# Patient Record
Sex: Female | Born: 1995
Health system: Southern US, Community
[De-identification: ages and names within clinical notes are randomized; demographics above are authoritative.]

## PROBLEM LIST (undated history)

## (undated) DIAGNOSIS — N200 Calculus of kidney: Secondary | ICD-10-CM

## (undated) DIAGNOSIS — L600 Ingrowing nail: Secondary | ICD-10-CM

## (undated) DIAGNOSIS — L309 Dermatitis, unspecified: Secondary | ICD-10-CM

## (undated) HISTORY — PX: ADENOIDECTOMY: SHX5191

## (undated) HISTORY — DX: Calculus of kidney: N20.0

## (undated) HISTORY — DX: Ingrowing nail: L60.0

## (undated) HISTORY — DX: Dermatitis, unspecified: L30.9

---
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
## 2017-04-28 DIAGNOSIS — L700 Acne vulgaris: Secondary | ICD-10-CM | POA: Diagnosis not present

## 2017-06-22 ENCOUNTER — Other Ambulatory Visit: Payer: Self-pay | Admitting: *Deleted

## 2017-06-22 MED ORDER — NORGESTIMATE-ETH ESTRADIOL 0.25-35 MG-MCG PO TABS: 1.0000 | ORAL_TABLET | Freq: Every day | ORAL | 0 refills | Status: DC

## 2017-06-23 ENCOUNTER — Other Ambulatory Visit: Payer: Self-pay | Admitting: *Deleted

## 2017-06-23 MED ORDER — NORGESTIMATE-ETH ESTRADIOL 0.25-35 MG-MCG PO TABS: 1.0000 | ORAL_TABLET | Freq: Every day | ORAL | 0 refills | Status: DC

## 2017-08-11 ENCOUNTER — Other Ambulatory Visit: Payer: Self-pay | Admitting: *Deleted

## 2017-08-11 MED ORDER — NORGESTIMATE-ETH ESTRADIOL 0.25-35 MG-MCG PO TABS: 1.0000 | ORAL_TABLET | Freq: Every day | ORAL | 3 refills | Status: DC

## 2017-08-27 DIAGNOSIS — M545 Low back pain: Secondary | ICD-10-CM | POA: Diagnosis not present

## 2017-08-27 DIAGNOSIS — M4325 Fusion of spine, thoracolumbar region: Secondary | ICD-10-CM | POA: Diagnosis not present

## 2017-08-27 DIAGNOSIS — M4324 Fusion of spine, thoracic region: Secondary | ICD-10-CM | POA: Diagnosis not present

## 2017-10-02 ENCOUNTER — Ambulatory Visit (INDEPENDENT_AMBULATORY_CARE_PROVIDER_SITE_OTHER): Payer: 59 | Admitting: Family Medicine

## 2017-10-02 ENCOUNTER — Encounter: Payer: Self-pay | Admitting: Family Medicine

## 2017-10-02 VITALS — BP 122/85 | HR 88 | Temp 98.2°F | Resp 20 | Ht 65.0 in | Wt 122.0 lb

## 2017-10-02 DIAGNOSIS — Z3041 Encounter for surveillance of contraceptive pills: Secondary | ICD-10-CM | POA: Diagnosis not present

## 2017-10-02 DIAGNOSIS — Z0000 Encounter for general adult medical examination without abnormal findings: Secondary | ICD-10-CM

## 2017-10-02 DIAGNOSIS — Z789 Other specified health status: Secondary | ICD-10-CM | POA: Insufficient documentation

## 2017-10-02 DIAGNOSIS — Z00129 Encounter for routine child health examination without abnormal findings: Secondary | ICD-10-CM

## 2017-10-02 DIAGNOSIS — L309 Dermatitis, unspecified: Secondary | ICD-10-CM

## 2017-10-02 MED ORDER — TRIAMCINOLONE ACETONIDE 0.1 % EX CREA: 1.0000 "application " | TOPICAL_CREAM | Freq: Two times a day (BID) | CUTANEOUS | 0 refills | Status: DC

## 2017-10-02 MED ORDER — NORGESTIMATE-ETH ESTRADIOL 0.25-35 MG-MCG PO TABS: 1.0000 | ORAL_TABLET | Freq: Every day | ORAL | 4 refills | Status: DC

## 2017-10-02 NOTE — Progress Notes (Signed)
Patient ID: Mehek Grega, female   DOB: March 01, 1996, 21 y.o.   MRN: [id number] Subjective:     History was provided by the patient.   Manasvi Dickard is a 21 y.o. female who is here for CPE.  She is starting her 2nd year of college, she is living with 5 other females in a suite. She has decided on majoring in psychology. She reports she did rather well last year for her first-year college. She does exercise and tries to eat healthy. She denies tobacco or drug use. She admits to some alcohol use but always uses a DD and has friends around.  Current Issues: Current concerns include:  BCP refills needed. She reports she is not sexually active, she is abstinent. Her menstrual cycle is regular now that on the birth control pill, last approximately 3-4 days and is light. She is very pleased with this. Patient's last menstrual period was 09/08/2017.   Immunization History  Administered Date(s) Administered  . DTaP 06/21/1998, 08/22/1998, 10/17/1998, 10/16/1999, 03/24/2003  . HPV Quadrivalent 06/30/2013, 08/31/2013, 01/02/2014  . Hepatitis B October 07, 1996, 05/16/1998, 10/17/1998  . HiB (PRP-OMP) 06/21/1998, 08/22/1998, 10/17/1998, 07/01/1999  . IPV 06/21/1998, 08/22/1998, 04/05/1999, 03/24/2003  . MMR 07/01/1999, 04/03/2003  . Meningococcal Conjugate 07/20/2014  . PPD Test 05/26/2016  . Tdap 05/10/2009  . Varicella 07/01/1999, 05/10/2009     Objective:     Vitals:   10/02/17 1355  BP: 122/85  Pulse: 88  Resp: 20  Temp: 98.2 F (36.8 C)  SpO2: 100%  Weight: 122 lb (55.3 kg)  Height: 5' 5"  (1.651 m)   Growth parameters are noted and are appropriate for age. Gen: Afebrile. No acute distress.  HENT: AT. [place]. Bilateral TM visualized and normal in appearance. MMM. Bilateral nares Without erythema or swelling. Throat without erythema or exudates. No cough, no hoarseness. Eyes:Pupils Equal Round Reactive to light, Extraocular movements intact,  Conjunctiva without redness, discharge or  icterus. Neck/lymp/endocrine: Supple, no lymphadenopathy, no thyromegaly CV: RRR no murmur, no edema, +2/4 P posterior tibialis pulses Chest: CTAB, no wheeze or crackles Abd: Soft. Flat. NTND. BS present. No Masses palpated.  Skin: Mild eczema left antecubital fossa, no purpura or petechiae.  Neuro:  Normal gait. PERLA. EOMi. Alert. Oriented. Cranial nerves II through XII intact. Muscle strength 5/5 upper and lower extremity extremity. Scoliosis present. DTRs equal bilaterally. Psych: Normal affect, dress and demeanor. Normal speech. Normal thought content and judgment..     No exam data present  Assessment:  Netasha Wehrli is a 21 y.o. healthy female present for CPE UTD immunizations - Flu shot given at school.  BCP Continual management- refills provided today for 1 year. Eczema: Triamcinolone cream prescribed, cerve after showers. Nutrition, Physical activity, Behavior, Emergency Care, [place], Safety and Handout given Follow-up one year for CPE  Electronically Signed by: Howard Pouch, DO [place] [facility] [place]

## 2017-10-02 NOTE — Patient Instructions (Signed)
Good luck to you this year in school.   Health Maintenance, Female Adopting a healthy lifestyle and getting preventive care can go a long way to promote health and wellness. Talk with your health care provider about what schedule of regular examinations is right for you. This is a good chance for you to check in with your provider about disease prevention and staying healthy. In between checkups, there are plenty of things you can do on your own. Experts have done a lot of research about which lifestyle changes and preventive measures are most likely to keep you healthy. Ask your health care provider for more information. Weight and diet Eat a healthy diet  Be sure to include plenty of vegetables, fruits, low-fat dairy products, and lean protein.  Do not eat a lot of foods high in solid fats, added sugars, or salt.  Get regular exercise. This is one of the most important things you can do for your health. ? Most adults should exercise for at least 150 minutes each week. The exercise should increase your heart rate and make you sweat (moderate-intensity exercise). ? Most adults should also do strengthening exercises at least twice a week. This is in addition to the moderate-intensity exercise.  Maintain a healthy weight  Body mass index (BMI) is a measurement that can be used to identify possible weight problems. It estimates body fat based on height and weight. Your health care provider can help determine your BMI and help you achieve or maintain a healthy weight.  For females 31 years of age and older: ? A BMI below 18.5 is considered underweight. ? A BMI of 18.5 to 24.9 is normal. ? A BMI of 25 to 29.9 is considered overweight. ? A BMI of 30 and above is considered obese.  Watch levels of cholesterol and blood lipids  You should start having your blood tested for lipids and cholesterol at 19 years of age, then have this test every 5 years.  You may need to have your cholesterol levels  checked more often if: ? Your lipid or cholesterol levels are high. ? You are older than 19 years of age. ? You are at high risk for heart disease.  Cancer screening Lung Cancer  Lung cancer screening is recommended for adults 23-83 years old who are at high risk for lung cancer because of a history of smoking.  A yearly low-dose CT scan of the lungs is recommended for people who: ? Currently smoke. ? Have quit within the past 15 years. ? Have at least a 30-pack-year history of smoking. A pack year is smoking an average of one pack of cigarettes a day for 1 year.  Yearly screening should continue until it has been 15 years since you quit.  Yearly screening should stop if you develop a health problem that would prevent you from having lung cancer treatment.  Breast Cancer  Practice breast self-awareness. This means understanding how your breasts normally appear and feel.  It also means doing regular breast self-exams. Let your health care provider know about any changes, no matter how small.  If you are in your 20s or 30s, you should have a clinical breast exam (CBE) by a health care provider every 1-3 years as part of a regular health exam.  If you are 56 or older, have a CBE every year. Also consider having a breast X-ray (mammogram) every year.  If you have a family history of breast cancer, talk to your health care provider  about genetic screening.  If you are at high risk for breast cancer, talk to your health care provider about having an MRI and a mammogram every year.  Breast cancer gene (BRCA) assessment is recommended for women who have family members with BRCA-related cancers. BRCA-related cancers include: ? Breast. ? Ovarian. ? Tubal. ? Peritoneal cancers.  Results of the assessment will determine the need for genetic counseling and BRCA1 and BRCA2 testing.  Cervical Cancer Your health care provider may recommend that you be screened regularly for cancer of the  pelvic organs (ovaries, uterus, and vagina). This screening involves a pelvic examination, including checking for microscopic changes to the surface of your cervix (Pap test). You may be encouraged to have this screening done every 3 years, beginning at age 3.  For women ages 67-65, health care providers may recommend pelvic exams and Pap testing every 3 years, or they may recommend the Pap and pelvic exam, combined with testing for human papilloma virus (HPV), every 5 years. Some types of HPV increase your risk of cervical cancer. Testing for HPV may also be done on women of any age with unclear Pap test results.  Other health care providers may not recommend any screening for nonpregnant women who are considered low risk for pelvic cancer and who do not have symptoms. Ask your health care provider if a screening pelvic exam is right for you.  If you have had past treatment for cervical cancer or a condition that could lead to cancer, you need Pap tests and screening for cancer for at least 20 years after your treatment. If Pap tests have been discontinued, your risk factors (such as having a new sexual partner) need to be reassessed to determine if screening should resume. Some women have medical problems that increase the chance of getting cervical cancer. In these cases, your health care provider may recommend more frequent screening and Pap tests.  Colorectal Cancer  This type of cancer can be detected and often prevented.  Routine colorectal cancer screening usually begins at 19 years of age and continues through 20 years of age.  Your health care provider may recommend screening at an earlier age if you have risk factors for colon cancer.  Your health care provider may also recommend using home test kits to check for hidden blood in the stool.  A small camera at the end of a tube can be used to examine your colon directly (sigmoidoscopy or colonoscopy). This is done to check for the  earliest forms of colorectal cancer.  Routine screening usually begins at age 46.  Direct examination of the colon should be repeated every 5-10 years through 19 years of age. However, you may need to be screened more often if early forms of precancerous polyps or small growths are found.  Skin Cancer  Check your skin from head to toe regularly.  Tell your health care provider about any new moles or changes in moles, especially if there is a change in a mole's shape or color.  Also tell your health care provider if you have a mole that is larger than the size of a pencil eraser.  Always use sunscreen. Apply sunscreen liberally and repeatedly throughout the day.  Protect yourself by wearing long sleeves, pants, a wide-brimmed hat, and sunglasses whenever you are outside.  Heart disease, diabetes, and high blood pressure  High blood pressure causes heart disease and increases the risk of stroke. High blood pressure is more likely to develop in: ?  People who have blood pressure in the high end of the normal range (130-139/85-89 mm Hg). ? People who are overweight or obese. ? People who are African American.  If you are 18-39 years of age, have your blood pressure checked every 3-5 years. If you are 40 years of age or older, have your blood pressure checked every year. You should have your blood pressure measured twice-once when you are at a hospital or clinic, and once when you are not at a hospital or clinic. Record the average of the two measurements. To check your blood pressure when you are not at a hospital or clinic, you can use: ? An automated blood pressure machine at a pharmacy. ? A home blood pressure monitor.  If you are between 55 years and 79 years old, ask your health care provider if you should take aspirin to prevent strokes.  Have regular diabetes screenings. This involves taking a blood sample to check your fasting blood sugar level. ? If you are at a normal weight and  have a low risk for diabetes, have this test once every three years after 19 years of age. ? If you are overweight and have a high risk for diabetes, consider being tested at a younger age or more often. Preventing infection Hepatitis B  If you have a higher risk for hepatitis B, you should be screened for this virus. You are considered at high risk for hepatitis B if: ? You were born in a country where hepatitis B is common. Ask your health care provider which countries are considered high risk. ? Your parents were born in a high-risk country, and you have not been immunized against hepatitis B (hepatitis B vaccine). ? You have HIV or AIDS. ? You use needles to inject street drugs. ? You live with someone who has hepatitis B. ? You have had sex with someone who has hepatitis B. ? You get hemodialysis treatment. ? You take certain medicines for conditions, including cancer, organ transplantation, and autoimmune conditions.  Hepatitis C  Blood testing is recommended for: ? Everyone born from 1945 through 1965. ? Anyone with known risk factors for hepatitis C.  Sexually transmitted infections (STIs)  You should be screened for sexually transmitted infections (STIs) including gonorrhea and chlamydia if: ? You are sexually active and are younger than 19 years of age. ? You are older than 19 years of age and your health care provider tells you that you are at risk for this type of infection. ? Your sexual activity has changed since you were last screened and you are at an increased risk for chlamydia or gonorrhea. Ask your health care provider if you are at risk.  If you do not have HIV, but are at risk, it may be recommended that you take a prescription medicine daily to prevent HIV infection. This is called pre-exposure prophylaxis (PrEP). You are considered at risk if: ? You are sexually active and do not regularly use condoms or know the HIV status of your partner(s). ? You take drugs by  injection. ? You are sexually active with a partner who has HIV.  Talk with your health care provider about whether you are at high risk of being infected with HIV. If you choose to begin PrEP, you should first be tested for HIV. You should then be tested every 3 months for as long as you are taking PrEP. Pregnancy  If you are premenopausal and you may become pregnant, ask your health   care provider about preconception counseling.  If you may become pregnant, take 400 to 800 micrograms (mcg) of folic acid every day.  If you want to prevent pregnancy, talk to your health care provider about birth control (contraception). Osteoporosis and menopause  Osteoporosis is a disease in which the bones lose minerals and strength with aging. This can result in serious bone fractures. Your risk for osteoporosis can be identified using a bone density scan.  If you are 38 years of age or older, or if you are at risk for osteoporosis and fractures, ask your health care provider if you should be screened.  Ask your health care provider whether you should take a calcium or vitamin D supplement to lower your risk for osteoporosis.  Menopause may have certain physical symptoms and risks.  Hormone replacement therapy may reduce some of these symptoms and risks. Talk to your health care provider about whether hormone replacement therapy is right for you. Follow these instructions at home:  Schedule regular health, dental, and eye exams.  Stay current with your immunizations.  Do not use any tobacco products including cigarettes, chewing tobacco, or electronic cigarettes.  If you are pregnant, do not drink alcohol.  If you are breastfeeding, limit how much and how often you drink alcohol.  Limit alcohol intake to no more than 1 drink per day for nonpregnant women. One drink equals 12 ounces of beer, 5 ounces of wine, or 1 ounces of hard liquor.  Do not use street drugs.  Do not share needles.  Ask  your health care provider for help if you need support or information about quitting drugs.  Tell your health care provider if you often feel depressed.  Tell your health care provider if you have ever been abused or do not feel safe at home. This information is not intended to replace advice given to you by your health care provider. Make sure you discuss any questions you have with your health care provider. Document Released: 06/16/2011 Document Revised: 05/08/2016 Document Reviewed: 09/04/2015 Elsevier Interactive Patient Education  [employer].

## 2018-04-29 DIAGNOSIS — M4325 Fusion of spine, thoracolumbar region: Secondary | ICD-10-CM | POA: Diagnosis not present

## 2018-04-29 DIAGNOSIS — M545 Low back pain: Secondary | ICD-10-CM | POA: Diagnosis not present

## 2018-05-17 DIAGNOSIS — M545 Low back pain: Secondary | ICD-10-CM | POA: Diagnosis not present

## 2018-05-17 DIAGNOSIS — M6281 Muscle weakness (generalized): Secondary | ICD-10-CM | POA: Diagnosis not present

## 2018-05-20 DIAGNOSIS — M545 Low back pain: Secondary | ICD-10-CM | POA: Diagnosis not present

## 2018-05-20 DIAGNOSIS — M6281 Muscle weakness (generalized): Secondary | ICD-10-CM | POA: Diagnosis not present

## 2018-05-24 DIAGNOSIS — L700 Acne vulgaris: Secondary | ICD-10-CM | POA: Diagnosis not present

## 2018-05-24 DIAGNOSIS — M545 Low back pain: Secondary | ICD-10-CM | POA: Diagnosis not present

## 2018-05-24 DIAGNOSIS — M6281 Muscle weakness (generalized): Secondary | ICD-10-CM | POA: Diagnosis not present

## 2018-05-27 DIAGNOSIS — M6281 Muscle weakness (generalized): Secondary | ICD-10-CM | POA: Diagnosis not present

## 2018-05-27 DIAGNOSIS — M545 Low back pain: Secondary | ICD-10-CM | POA: Diagnosis not present

## 2018-05-31 DIAGNOSIS — M6281 Muscle weakness (generalized): Secondary | ICD-10-CM | POA: Diagnosis not present

## 2018-05-31 DIAGNOSIS — M545 Low back pain: Secondary | ICD-10-CM | POA: Diagnosis not present

## 2018-06-03 DIAGNOSIS — M6281 Muscle weakness (generalized): Secondary | ICD-10-CM | POA: Diagnosis not present

## 2018-06-03 DIAGNOSIS — M545 Low back pain: Secondary | ICD-10-CM | POA: Diagnosis not present

## 2018-06-07 DIAGNOSIS — M6281 Muscle weakness (generalized): Secondary | ICD-10-CM | POA: Diagnosis not present

## 2018-06-07 DIAGNOSIS — M545 Low back pain: Secondary | ICD-10-CM | POA: Diagnosis not present

## 2018-06-14 DIAGNOSIS — M545 Low back pain: Secondary | ICD-10-CM | POA: Diagnosis not present

## 2018-06-14 DIAGNOSIS — M6281 Muscle weakness (generalized): Secondary | ICD-10-CM | POA: Diagnosis not present

## 2018-06-28 DIAGNOSIS — M545 Low back pain: Secondary | ICD-10-CM | POA: Diagnosis not present

## 2018-06-28 DIAGNOSIS — M6281 Muscle weakness (generalized): Secondary | ICD-10-CM | POA: Diagnosis not present

## 2018-10-15 ENCOUNTER — Encounter: Payer: Self-pay | Admitting: Family Medicine

## 2018-11-10 ENCOUNTER — Encounter: Payer: 59 | Admitting: Family Medicine

## 2018-11-29 ENCOUNTER — Encounter: Payer: Self-pay | Admitting: Family Medicine

## 2018-11-29 ENCOUNTER — Ambulatory Visit (INDEPENDENT_AMBULATORY_CARE_PROVIDER_SITE_OTHER): Payer: 59 | Admitting: Family Medicine

## 2018-11-29 VITALS — BP 117/78 | HR 94 | Temp 98.5°F | Resp 16 | Ht 65.75 in | Wt 123.0 lb

## 2018-11-29 DIAGNOSIS — Z0000 Encounter for general adult medical examination without abnormal findings: Secondary | ICD-10-CM

## 2018-11-29 DIAGNOSIS — Z130 Encounter for screening for diseases of the blood and blood-forming organs and certain disorders involving the immune mechanism: Secondary | ICD-10-CM | POA: Diagnosis not present

## 2018-11-29 DIAGNOSIS — Z789 Other specified health status: Secondary | ICD-10-CM

## 2018-11-29 DIAGNOSIS — L309 Dermatitis, unspecified: Secondary | ICD-10-CM | POA: Diagnosis not present

## 2018-11-29 DIAGNOSIS — Z3041 Encounter for surveillance of contraceptive pills: Secondary | ICD-10-CM

## 2018-11-29 DIAGNOSIS — M549 Dorsalgia, unspecified: Secondary | ICD-10-CM | POA: Insufficient documentation

## 2018-11-29 DIAGNOSIS — M419 Scoliosis, unspecified: Secondary | ICD-10-CM | POA: Insufficient documentation

## 2018-11-29 LAB — CBC WITH DIFFERENTIAL/PLATELET
BASOS ABS: 0 10*3/uL (ref 0.0–0.1)
Basophils Relative: 0.6 % (ref 0.0–3.0)
EOS ABS: 0.2 10*3/uL (ref 0.0–0.7)
Eosinophils Relative: 2.2 % (ref 0.0–5.0)
HEMATOCRIT: 42.5 % (ref 36.0–46.0)
Hemoglobin: 14.3 g/dL (ref 12.0–15.0)
Lymphocytes Relative: 32.3 % (ref 12.0–46.0)
Lymphs Abs: 2.3 10*3/uL (ref 0.7–4.0)
MCHC: 33.6 g/dL (ref 30.0–36.0)
MCV: 92.8 fl (ref 78.0–100.0)
MONOS PCT: 4.7 % (ref 3.0–12.0)
Monocytes Absolute: 0.3 10*3/uL (ref 0.1–1.0)
NEUTROS PCT: 60.2 % (ref 43.0–77.0)
Neutro Abs: 4.3 10*3/uL (ref 1.4–7.7)
Platelets: 245 10*3/uL (ref 150.0–400.0)
RBC: 4.58 Mil/uL (ref 3.87–5.11)
RDW: 12.6 % (ref 11.5–14.6)
WBC: 7.2 10*3/uL (ref 4.5–10.5)

## 2018-11-29 LAB — COMPREHENSIVE METABOLIC PANEL
ALK PHOS: 44 U/L (ref 39–117)
ALT: 8 U/L (ref 0–35)
AST: 17 U/L (ref 0–37)
Albumin: 4.4 g/dL (ref 3.5–5.2)
BILIRUBIN TOTAL: 0.7 mg/dL (ref 0.2–1.2)
BUN: 11 mg/dL (ref 6–23)
CALCIUM: 9.8 mg/dL (ref 8.4–10.5)
CO2: 28 mEq/L (ref 19–32)
CREATININE: 0.75 mg/dL (ref 0.40–1.20)
Chloride: 103 mEq/L (ref 96–112)
GFR: 104.05 mL/min (ref >60.00)
GLUCOSE: 96 mg/dL (ref 70–99)
Potassium: 4.3 mEq/L (ref 3.5–5.1)
Sodium: 137 mEq/L (ref 135–145)
TOTAL PROTEIN: 6.6 g/dL (ref 6.0–8.3)

## 2018-11-29 LAB — LIPID PANEL
CHOL/HDL RATIO: 2
Cholesterol: 121 mg/dL (ref 0–200)
HDL: 57.1 mg/dL (ref >39.00)
LDL Cholesterol: 34 mg/dL (ref 0–99)
NONHDL: 63.4
Triglycerides: 148 mg/dL (ref 0.0–149.0)
VLDL: 29.6 mg/dL (ref 0.0–40.0)

## 2018-11-29 MED ORDER — NORGESTIMATE-ETH ESTRADIOL 0.25-35 MG-MCG PO TABS: 1.0000 | ORAL_TABLET | Freq: Every day | ORAL | 4 refills | Status: DC

## 2018-11-29 MED ORDER — TRIAMCINOLONE ACETONIDE 0.1 % EX CREA: 1.0000 "application " | TOPICAL_CREAM | Freq: Two times a day (BID) | CUTANEOUS | 0 refills | Status: DC

## 2018-11-29 MED ORDER — NORGESTIMATE-ETH ESTRADIOL 0.25-35 MG-MCG PO TABS: 1.0000 | ORAL_TABLET | Freq: Every day | ORAL | 4 refills | Status: AC

## 2018-11-29 NOTE — Patient Instructions (Signed)

## 2018-11-29 NOTE — Progress Notes (Signed)
Patient ID: Sonya Love, female  DOB: July 02, 1996, 22 y.o.   MRN: [id number] Patient Care Team    Relationship Specialty Notifications Start End  Ma Hillock, DO PCP - General Family Medicine  07/25/15   Leandrew Koyanagi., MD Consulting Physician Urology  02/03/12     Chief Complaint  Patient presents with  . Annual Exam    Pt is not fasting. Pt declines the flu vaccine.    Subjective:  Sonya Love is a 22 y.o.  Female  present for CPE. All past medical history, surgical history, allergies, family history, immunizations, medications and social history were updated in the electronic medical record today. All recent labs, ED visits and hospitalizations within the last year were reviewed.  Well women: cycles are routine on BCP, menses 4 days and light. Patient's last menstrual period was 11/02/2018 (approximate). She has never been  SA. She denies vaginal irritation or discharge.   Health maintenance:  Colonoscopy: no fhx, routine screen Mammogram: no fhx, routine screen Cervical cancer screening: no prior. Patient's last menstrual period was 11/02/2018 (approximate). Immunizations: tdap 05/10/2009, Influenza declined (encouraged yearly) Infectious disease screening: HIV not indicated DEXA: N/A Assistive device: none Oxygen use: none Patient has a Dental home. Hospitalizations/ED visits: reviewed  Depression screen [facility] 2/9 11/29/2018 10/02/2017 10/02/2016  Decreased Interest 0 0 0  Down, Depressed, Hopeless 0 0 0  PHQ - 2 Score 0 0 0   No flowsheet data found.   Immunization History  Administered Date(s) Administered  . DTaP 06/21/1998, 08/22/1998, 10/17/1998, 10/16/1999, 03/24/2003  . HPV Quadrivalent 06/30/2013, 08/31/2013, 01/02/2014  . Hepatitis B 10-Aug-1998, 05/16/1998, 10/17/1998  . HiB (PRP-OMP) 06/21/1998, 08/22/1998, 10/17/1998, 07/01/1999  . IPV 06/21/1998, 08/22/1998, 04/05/1999, 03/24/2003  . MMR 07/01/1999, 04/03/2003  . Meningococcal Conjugate 07/20/2014    . PPD Test 05/26/2016  . Tdap 05/10/2009  . Varicella 07/01/1999, 05/10/2009     Past Medical History:  Diagnosis Date  . Eczema    Usually arms and neck--OTC hydrocortisone usually effective  . Ingrown left big toenail 2013  . Ingrown right big toenail 2013  . Kidney stone   . Nephrolithiasis 11/2011   First episode Christmas 2012   No Known Allergies Past Surgical History:  Procedure Laterality Date  . ADENOIDECTOMY  age 48  . spinal fushion  06/11/2015  . TOENAIL EXCISION  2013   both feet great toes   Family History  Problem Relation Age of Onset  . Diabetes Paternal Grandmother   . COPD Paternal Grandmother   . Lung cancer Maternal Grandmother        lung  . Stroke Mother    Social History   Socioeconomic History  . Marital status: Single    Spouse name: Not on file  . Number of children: Not on file  . Years of education: Not on file  . Highest education level: Not on file  Occupational History  . Occupation: high Retail buyer  Social Needs  . Financial resource strain: Not on file  . Food insecurity:    Worry: Not on file    Inability: Not on file  . Transportation needs:    Medical: Not on file    Non-medical: Not on file  Tobacco Use  . Smoking status: Never Smoker  . Smokeless tobacco: Never Used  Substance and Sexual Activity  . Alcohol use: No  . Drug use: No  . Sexual activity: Never    Comment: never been sexually active  Lifestyle  .  Physical activity:    Days per week: Not on file    Minutes per session: Not on file  . Stress: Not on file  Relationships  . Social connections:    Talks on phone: Not on file    Gets together: Not on file    Attends religious service: Not on file    Active member of club or organization: Not on file    Attends meetings of clubs or organizations: Not on file    Relationship status: Not on file  . Intimate partner violence:    Fear of current or ex partner: Not on file    Emotionally abused: Not  on file    Physically abused: Not on file    Forced sexual activity: Not on file  Other Topics Concern  . Not on file  Social History Narrative   [employer], rising [employer].   Has one older brother in the [employer]. Half sister.   Lives with mother. Plays piano. Taking Spanish.    Allergies as of 11/29/2018   No Known Allergies     Medication List       Accurate as of November 29, 2018  2:15 PM. Always use your most recent med list.        minocycline 100 MG capsule Commonly known as:  MINOCIN,DYNACIN Take 100 mg by mouth 2 (two) times daily.   multivitamin capsule Take 1 capsule by mouth daily.   norgestimate-ethinyl estradiol 0.25-35 MG-MCG tablet Commonly known as:  SPRINTEC 28 Take 1 tablet by mouth daily.   POTASSIUM CITRATE PO Take by mouth 2 (two) times daily.   triamcinolone cream 0.1 % Commonly known as:  KENALOG Apply 1 application topically 2 (two) times daily.       All past medical history, surgical history, allergies, family history, immunizations andmedications were updated in the EMR today and reviewed under the history and medication portions of their EMR.     No results found for this or any previous visit (from the past 2160 hour(s)).   ROS: 14 pt review of systems performed and negative (unless mentioned in an HPI)  Objective: BP 117/78 (BP Location: Left Arm, Patient Position: Sitting, Cuff Size: Normal)   Pulse 94   Temp 98.5 F (36.9 C) (Oral)   Resp 16   Ht 5' 5.75" (1.67 m)   Wt 123 lb (55.8 kg)   LMP 11/02/2018 (Approximate)   SpO2 98%   BMI 20.00 kg/m  Gen: Afebrile. No acute distress. Nontoxic in appearance, well-developed, well-nourished, pleasant caucasian female.  HENT: AT. [place]. Bilateral TM visualized and normal in appearance, normal external auditory canal. MMM, no oral lesions, adequate dentition. Bilateral nares within normal limits. Throat without erythema, ulcerations or exudates. no Cough on exam, no  hoarseness on exam. Eyes:Pupils Equal Round Reactive to light, Extraocular movements intact,  Conjunctiva without redness, discharge or icterus. Neck/lymp/endocrine: Supple,no lymphadenopathy, no thyromegaly CV: RRR no murmur, no edema, +2/4 P posterior tibialis pulses.  Chest: CTAB, no wheeze, rhonchi or crackles. normal Respiratory effort.  Air movement. Abd: Soft. flat. NTND. BS present. no Masses palpated. No hepatosplenomegaly. No rebound tenderness or guarding. Skin: no rashes, purpura or petechiae. Warm and well-perfused. Skin intact. Neuro/Msk:  Normal gait. PERLA. EOMi. Alert. Oriented x3.  Cranial nerves II through XII intact. Muscle strength 5/5 upper/lower extremity. DTRs equal bilaterally. Psych: Normal affect, dress and demeanor. Normal speech. Normal thought content and judgment.  No exam data present  Assessment/plan: Sonya Love is  a 22 y.o. female present for CPE Encounter for preventive health examination Patient was encouraged to exercise greater than 150 minutes a week. Patient was encouraged to choose a diet filled with fresh fruits and vegetables, and lean meats. AVS provided to patient today for education/recommendation on gender specific health and safety maintenance. Colonoscopy: no fhx, routine screen Mammogram: no fhx, routine screen Cervical cancer screening: no prior. Patient's last menstrual period was 11/02/2018 (approximate). Discussed PAP to start at 23. She has never been SA. Encouraged Gynecology establishment within the next year.  Immunizations: tdap 05/10/2009, Influenza declined (encouraged yearly) Infectious disease screening: HIV not indicated. Never SA DEXA: N/A birth control Maintenance  - stable- refills provided today. Never SA. - Comp Met (CMET) - Lipid Profile - norgestimate-ethinyl estradiol (SPRINTEC 28) 0.25-35 MG-MCG tablet; Take 1 tablet by mouth daily.  Dispense: 3 Package; Refill: 4 Eczema, unspecified type - stable.  - triamcinolone  cream (KENALOG) 0.1 %; Apply 1 application topically 2 (two) times daily.  Dispense: 30 g; Refill: 0 Screening for deficiency anemia - CBC w/Diff  Her td is due in 04/2019- if she desires she can have completed here by nurse visit at that time.   Return in about 1 year (around 11/30/2019) for CPE.  Electronically signed by: Howard Pouch, DO [place]

## 2019-04-08 ENCOUNTER — Other Ambulatory Visit: Payer: Self-pay | Admitting: Family Medicine

## 2019-04-08 DIAGNOSIS — L309 Dermatitis, unspecified: Secondary | ICD-10-CM

## 2020-02-13 LAB — OB RESULTS CONSOLE GC/CHLAMYDIA: Chlamydia: NEGATIVE

## 2020-02-13 LAB — HM PAP SMEAR: HM Pap smear: NORMAL

## 2020-05-30 ENCOUNTER — Ambulatory Visit: Payer: 59 | Admitting: Family Medicine

## 2020-06-05 ENCOUNTER — Encounter: Payer: Self-pay | Admitting: Family Medicine

## 2020-06-05 ENCOUNTER — Other Ambulatory Visit: Payer: Self-pay

## 2020-06-05 ENCOUNTER — Other Ambulatory Visit (HOSPITAL_COMMUNITY)
Admission: RE | Admit: 2020-06-05 | Discharge: 2020-06-05 | Disposition: A | Discharge status: Home / Self Care | Payer: 59 | Source: Ambulatory Visit | Attending: Family Medicine | Admitting: Family Medicine

## 2020-06-05 ENCOUNTER — Ambulatory Visit (INDEPENDENT_AMBULATORY_CARE_PROVIDER_SITE_OTHER): Payer: 59 | Admitting: Family Medicine

## 2020-06-05 VITALS — BP 123/86 | HR 74 | Temp 98.3°F | Resp 17 | Ht 66.0 in | Wt 106.4 lb

## 2020-06-05 DIAGNOSIS — Z113 Encounter for screening for infections with a predominantly sexual mode of transmission: Secondary | ICD-10-CM | POA: Diagnosis not present

## 2020-06-05 DIAGNOSIS — B379 Candidiasis, unspecified: Secondary | ICD-10-CM | POA: Diagnosis present

## 2020-06-05 DIAGNOSIS — N898 Other specified noninflammatory disorders of vagina: Secondary | ICD-10-CM | POA: Insufficient documentation

## 2020-06-05 DIAGNOSIS — Z131 Encounter for screening for diabetes mellitus: Secondary | ICD-10-CM

## 2020-06-05 DIAGNOSIS — N63 Unspecified lump in unspecified breast: Secondary | ICD-10-CM

## 2020-06-05 NOTE — Progress Notes (Signed)
This visit occurred during the SARS-CoV-2 public health emergency.  Safety protocols were in place, including screening questions prior to the visit, additional usage of staff PPE, and extensive cleaning of exam room while observing appropriate contact time as indicated for disinfecting solutions.    Sonya Love , 1996-08-02, 24 y.o., female MRN: [id number] Patient Care Team    Relationship Specialty Notifications Start End  Natalia Leatherwood, DO PCP - General Family Medicine  07/25/15   Renaye Rakers., MD Consulting Physician Urology  02/03/12     Chief Complaint  Patient presents with  . Vaginitis    Pt was dx with BV and yeast. She has been dx with both a couple of times by GYN and continues to have the issue. GYN is at [facility] [place] [phone]. BV meds were taken at the end of May   . Mass    Pt has lump on R breast that GYN found but said it was the same on the other side so it was not concerning. Pt worried about this. No discharge/drainage.      Subjective: Pt presents for an OV with complaints of vaginal irritation.  She reports this has been off and on March.  She has seen a gynecologist near [facility] which completed a Pap and also diagnosed her with BV.  She was then started on Flagyl x7 days.  Up to the Flagyl she noticed itching and burning and was diagnosed with a yeast infection and provided with Diflucan.  She did notice to her sexual activity she again had some itching and burning and again was diagnosed with a yeast infection and was prescribed Diflucan x1 with repeat in 72 hours.  At the Diflucan she again was diagnosed with BV and was prescribed Flagyl. She is frustrated and presents here today with vaginal itching and irritation.  She is sexually active with 1 female partner.  She reports condom use.  Denies any change in hygiene products.  She uses panty liners.  She does office routinely shave her pubic area.  Patient also endorses a right breast  lump.  She reports it was palpated on her gynecological exam March 1.  Since then she feels her lump is becoming larger.  Depression screen [facility] 2/9 11/29/2018 10/02/2017 10/02/2016  Decreased Interest 0 0 0  Down, Depressed, Hopeless 0 0 0  PHQ - 2 Score 0 0 0    No Known Allergies Social History   Social History Narrative   [employer], [employer].   Has one older brother in the [employer]. Half sister.   Lives with mother. Plays piano. Taking Spanish.    Past Medical History:  Diagnosis Date  . Eczema    Usually arms and neck--OTC hydrocortisone usually effective  . Ingrown left big toenail 2013  . Ingrown right big toenail 2013  . Kidney stone   . Nephrolithiasis 11/2011   First episode Christmas 2012   Past Surgical History:  Procedure Laterality Date  . ADENOIDECTOMY  age 46  . spinal fushion  06/11/2015  . TOENAIL EXCISION  2013   both feet great toes   Family History  Problem Relation Age of Onset  . Diabetes Paternal Grandmother   . COPD Paternal Grandmother   . Lung cancer Maternal Grandmother        lung  . Stroke Mother    Allergies as of 06/05/2020   No Known Allergies     Medication List  Accurate as of June 05, 2020 11:59 PM. If you have any questions, ask your nurse or doctor.        STOP taking these medications   minocycline 100 MG capsule Commonly known as: MINOCIN Stopped by: Felix Pacini, DO   multivitamin capsule Stopped by: Felix Pacini, DO   POTASSIUM CITRATE PO Stopped by: Felix Pacini, DO   triamcinolone cream 0.1 % Commonly known as: KENALOG Stopped by: Felix Pacini, DO     TAKE these medications   norgestimate-ethinyl estradiol 0.25-35 MG-MCG tablet Commonly known as: Sprintec 28 Take 1 tablet by mouth daily.   PROBIOTIC ADVANCED PO Take by mouth.       All past medical history, surgical history, allergies, family history, immunizations andmedications were updated in the EMR today and reviewed  under the history and medication portions of their EMR.     ROS: Negative, with the exception of above mentioned in HPI   Objective:  BP 123/86 (BP Location: Left Arm, Patient Position: Sitting, Cuff Size: Normal)   Pulse 74   Temp 98.3 F (36.8 C) (Temporal)   Resp 17   Ht 5\' 6"  (1.676 m)   Wt 106 lb 6 oz (48.3 kg)   LMP 05/16/2020 (Exact Date)   SpO2 100%   BMI 17.17 kg/m  Body mass index is 17.17 kg/m. Gen: Afebrile. No acute distress. Nontoxic in appearance, well developed, well nourished.  HENT: AT. [place].  Eyes:Pupils Equal Round Reactive to light, Extraocular movements intact,  Conjunctiva without redness, discharge or icterus. Abd: Soft.  Flat. NTND. BS present.  No masses palpated. No rebound or guarding.  Breasts: breasts appear normal, symmetrical, no tenderness on exam, right breast mass palpated 12 o'clock position about 1 inch superior of areola.  No suspicious masses left breast, no skin or nipple changes or axillary nodes.  Very dense breast tissue bilaterally. GYN:  External genitalia within normal limits, normal hair distribution-razor bumps appreciated, no lesions. Urethral meatus normal, no lesions. Vaginal mucosa pink, moist, normal rugae, no lesions. No cystocele or rectocele. cervix without lesions, mild greenish-white discharge present. Bimanual exam revealed normal uterus.  No bladder/suprapubic fullness, masses or tenderness. No cervical motion tenderness. No adnexal fullness. Anus and perineum within normal limits, no lesions.  No exam data present No results found. No results found for this or any previous visit (from the past 24 hour(s)).  Assessment/Plan: Takera Rayl is a 24 y.o. female present for OV for  Vaginal irritation/STD screening No records available prior to the patient's appointment today on prior testing completed at gynecology.  Patient did have mild-moderate vaginal discharge on exam today and evidence of razor burn/rash. Elected to wait on  treating until wet prep is resulted.  Patient is in agreement. Avoid sexual activity until results are provided and if necessary until after treatment is provided. Encouraged her to not shave for a few weeks.  And when she returns to shaving she should replace her razor and shaved in the direction of hair growth. She was encouraged to avoid tampon use or scented body washes or panty liners.  When cleansing use Dove sensitive or Ivory soap. - Cervicovaginal ancillary only( [place]) - Hemoglobin A1c - HSV(herpes simplex vrs) 1+2 ab-IgG - HIV antibody (with reflex)  Diabetes mellitus screening/recurrent yeast infection - Hemoglobin A1c  Breast mass in female Nickel size breast mass palpated in area mentioned above.  Nontender.  Possible fibroadenoma.  Will obtain breast ultrasound for further evaluation. - 21 BREAST COMPLETE UNI RIGHT  INC AXILLA; Future   Reviewed expectations re: course of current medical issues.  Discussed self-management of symptoms.  Outlined signs and symptoms indicating need for more acute intervention.  Patient verbalized understanding and all questions were answered.  Patient received an After-Visit Summary.    Orders Placed This Encounter  Procedures  . US BREAST COMPLETE UNI RIGHT INC AXILLA  . Hemoglobin A1c  . HSV(herpes simplex vrs) 1+2 ab-IgG  . HIV antibody (with reflex)   No orders of the defined types were placed in this encounter.  Referral Orders  No referral(s) requested today    > 45 Minutes was dedicated to this patient's encounter to include pre-visit review of chart, face-to-face time with patient and post-visit work- which include documentation and prescribing medications and/or ordering test when necessary.    Note is dictated utilizing voice recognition software. Although note has been proof read prior to signing, occasional typographical errors still can be missed. If any questions arise, please do not hesitate to call for  verification.   electronically signed by:  Howard Pouch, DO  [place]

## 2020-06-05 NOTE — Patient Instructions (Addendum)
Avoid scented panty liners.  Use ivory or dove sensitive bar soap.  Switch razors and if shaving> shave in the direction of hair not against.  Avoid sexual activity for now.  We will call you with lab results and guide you further on recommendations.   You will also get a call to schedule your Ultrasound of your breast.          Do not use tampons until your health care provider approves.  Try taking a sitz bath to help with discomfort. This is a warm water bath that is taken while you are sitting down. The water should only come up to your hips and should cover your buttocks. Do this 3-4 times per day or as told by your health care provider.  Do not douche.  If you have diabetes, keep your blood sugar levels under control.  Keep all follow-up visits as told by your health care provider. This is important. Contact a health care provider if:  You have a fever.  Your symptoms go away and then return.  Your symptoms do not get better with treatment.  Your symptoms get worse.  You have new symptoms.  You develop blisters in or around your vagina.  You have blood coming from your vagina and it is not your menstrual period.  You develop pain in your abdomen. Summary  Vaginal yeast infection is a condition that causes discharge as well as soreness, swelling, and redness (inflammation) of the vagina.  This condition is treated with medicine. Medicines may be over-the-counter or prescription.  Take or apply over-the-counter and prescription medicines only as told by your health care provider.  Do not douche. Do not have sex or use tampons until your health care provider approves.  Contact a health care provider if your symptoms do not get better with treatment or your symptoms go away and then return. This information is not intended to replace advice given to you by your health care provider. Make sure you discuss any questions you have with your health care  provider. Document Revised: 07/01/2019 Document Reviewed: 04/19/2018 Elsevier Patient Education  2020 [employer].

## 2020-06-06 LAB — HSV(HERPES SIMPLEX VRS) I + II AB-IGG
HAV 1 IGG,TYPE SPECIFIC AB: <0.9 index
HSV 2 IGG,TYPE SPECIFIC AB: <0.9 index

## 2020-06-06 LAB — HEMOGLOBIN A1C
Hgb A1c MFr Bld: 4.8 % of total Hgb (ref <5.7)
Mean Plasma Glucose: 91 (calc)
eAG (mmol/L): 5 (calc)

## 2020-06-06 LAB — HIV ANTIBODY (ROUTINE TESTING W REFLEX): HIV 1&2 Ab, 4th Generation: NONREACTIVE

## 2020-06-07 ENCOUNTER — Encounter: Payer: Self-pay | Admitting: Family Medicine

## 2020-06-07 ENCOUNTER — Other Ambulatory Visit: Payer: Self-pay | Admitting: Family Medicine

## 2020-06-07 LAB — CERVICOVAGINAL ANCILLARY ONLY
Bacterial Vaginitis (gardnerella): POSITIVE — AB
Candida Glabrata: NEGATIVE
Candida Vaginitis: NEGATIVE
Chlamydia: NEGATIVE
Comment: NEGATIVE
Comment: NEGATIVE
Comment: NEGATIVE
Comment: NEGATIVE
Comment: NEGATIVE
Comment: NORMAL
Neisseria Gonorrhea: NEGATIVE
Trichomonas: NEGATIVE

## 2020-06-07 NOTE — Addendum Note (Signed)
Addended by: Daryel November L on: 06/07/2020 04:22 PM   Modules accepted: Orders

## 2020-06-07 NOTE — Telephone Encounter (Signed)
Pt was called and given information. Pharmacy added. She is to start her period next week and is asking whether she can still use the gel nightly even with being on her period?   Please advise

## 2020-06-07 NOTE — Telephone Encounter (Signed)
Please inform patient Her lab test revealed bacterial vaginosis is present. Yeast, chlamydia, gonorrhea, trichomonas, HSV and HIV are all negative.  Since she has had difficulty with recurrent BV/yeast infections with the treatment she has received with gynecology via pills. I believe we need to take a different approach for treatment this time and use the MetroGel. She will use 1 applicatorful of the gel intravaginally every night before bed for 7 days.  I would encourage her to avoid shaving for now. When she does return to shaving shave with the direction of hair and use a new razor. Caution on shaving creams, scented products such as body washes, pantiliners etc.  Lastly, she reported condom use with sexual activity. First,  would encourage her to avoid sexual activity until at least 1 week after treatment is completed. If they are using any type of lubrication with sexual activity including lubricated condoms, this may also be affecting her pH and causing her recurrent infections. She may want to try a different type of condom and use water-based lubricants.   Lastly I pended the MetroGel, please ask her where she would like that sent to, I believe she is in [place] area now.

## 2020-06-08 MED ORDER — METRONIDAZOLE 0.75 % VA GEL: 1.0000 | Freq: Every day | VAGINAL | 1 refills | Status: AC

## 2020-06-08 NOTE — Telephone Encounter (Signed)
Pt was called and given information/instructions

## 2020-06-08 NOTE — Telephone Encounter (Signed)
Yes, it is okay to use the MetroGel nightly even if on her menstrual cycle. BV usually occurs in women when there is less of an acidic environment, for what ever that reason is.  A women's menstrual cycle causes a less acidic  environment in comparison to when not actively bleeding.   Women with more frequent BV infections can notice they occur during or directly surrounding menstrual cycle.  I have put a refill on the MetroGel also in the event she would need it.

## 2020-06-25 ENCOUNTER — Ambulatory Visit
Admission: RE | Admit: 2020-06-25 | Discharge: 2020-06-25 | Disposition: A | Discharge status: Home / Self Care | Payer: 59 | Source: Ambulatory Visit | Attending: Family Medicine | Admitting: Family Medicine

## 2020-06-25 ENCOUNTER — Other Ambulatory Visit: Payer: Self-pay

## 2020-06-25 DIAGNOSIS — N63 Unspecified lump in unspecified breast: Secondary | ICD-10-CM

## 2021-03-20 IMAGING — US US BREAST*R* LIMITED INC AXILLA
1 series · 4 of 4 positions shown · non-contrast
Comparison: None.

CLINICAL DATA: 24-year-old female complaining of a palpable
abnormality in the 12 o'clock region of the right breast. Patient
states that she has recently had a 12 pound weight loss.

EXAM:
ULTRASOUND OF THE RIGHT BREAST

[Series 1: us breast*right* limited inc axilla · 0.06mm/px · 4 of 4 slices shown]
[im 1/4]
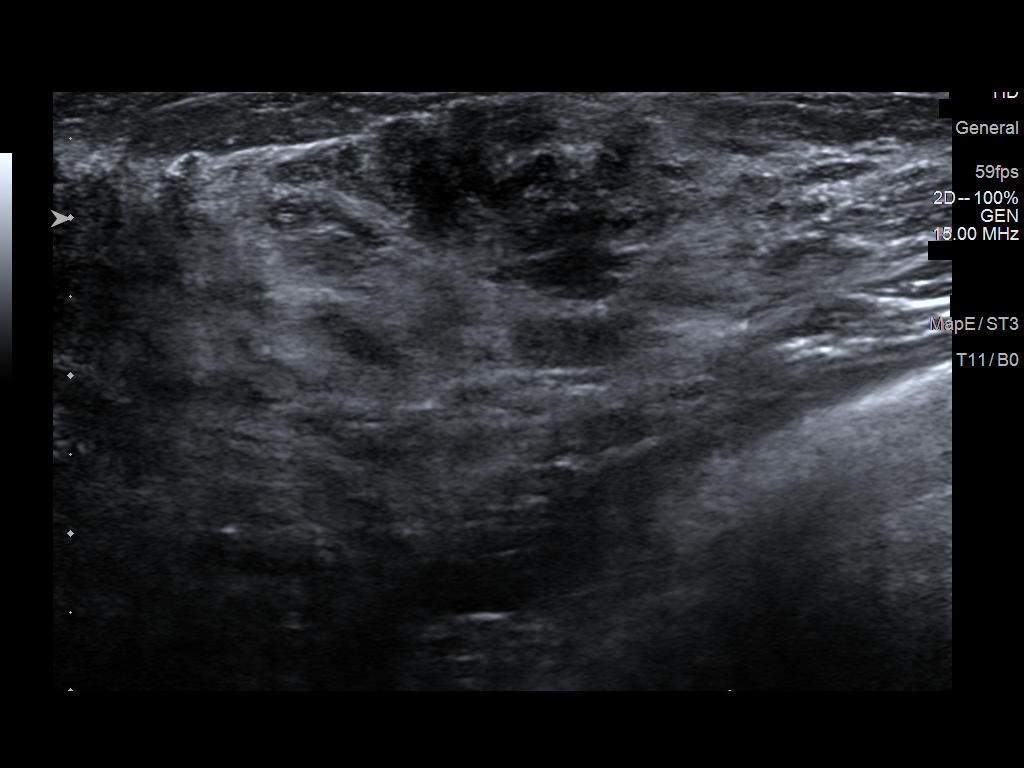
[im 2/4]
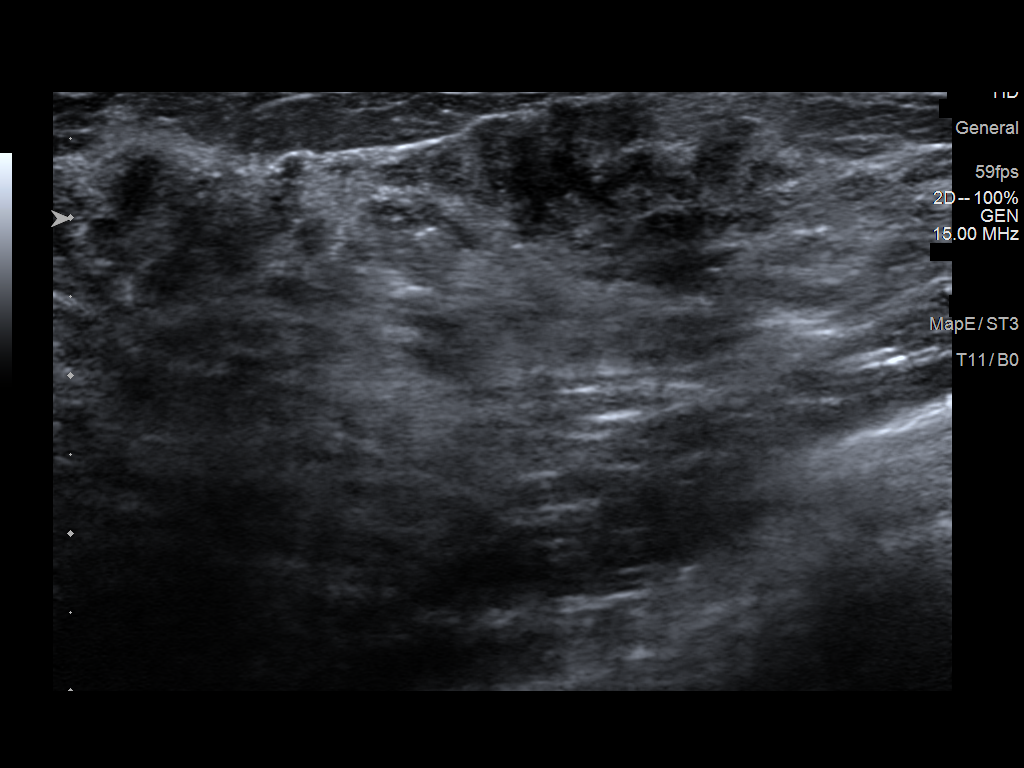
[im 3/4]
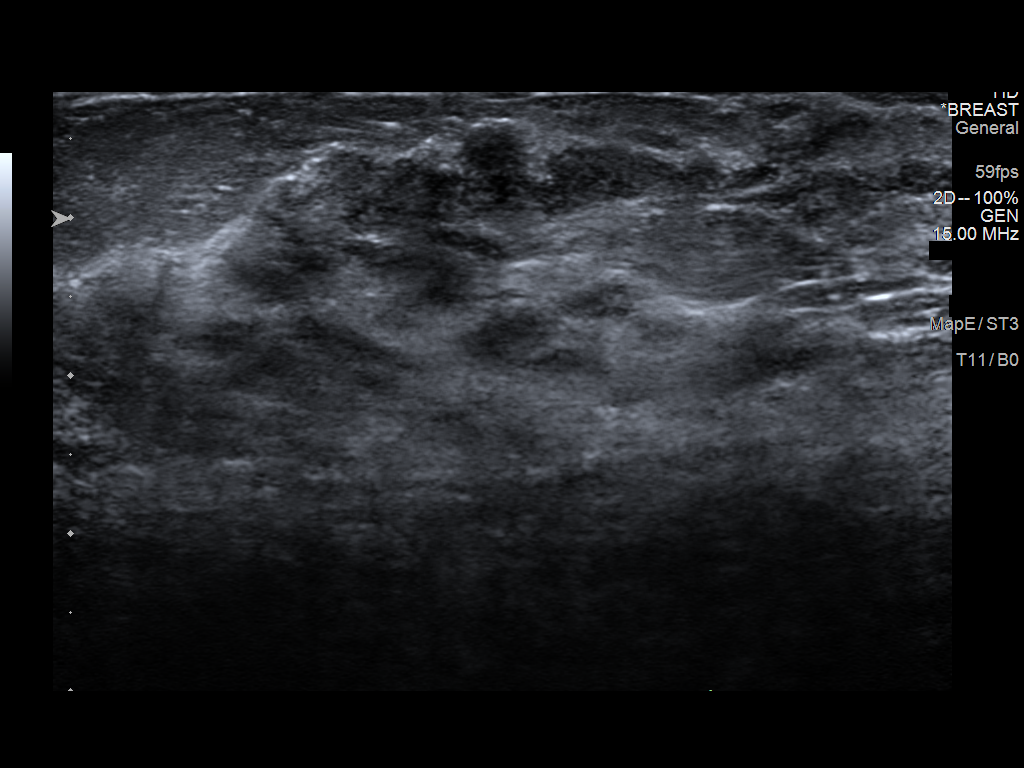
[im 4/4]
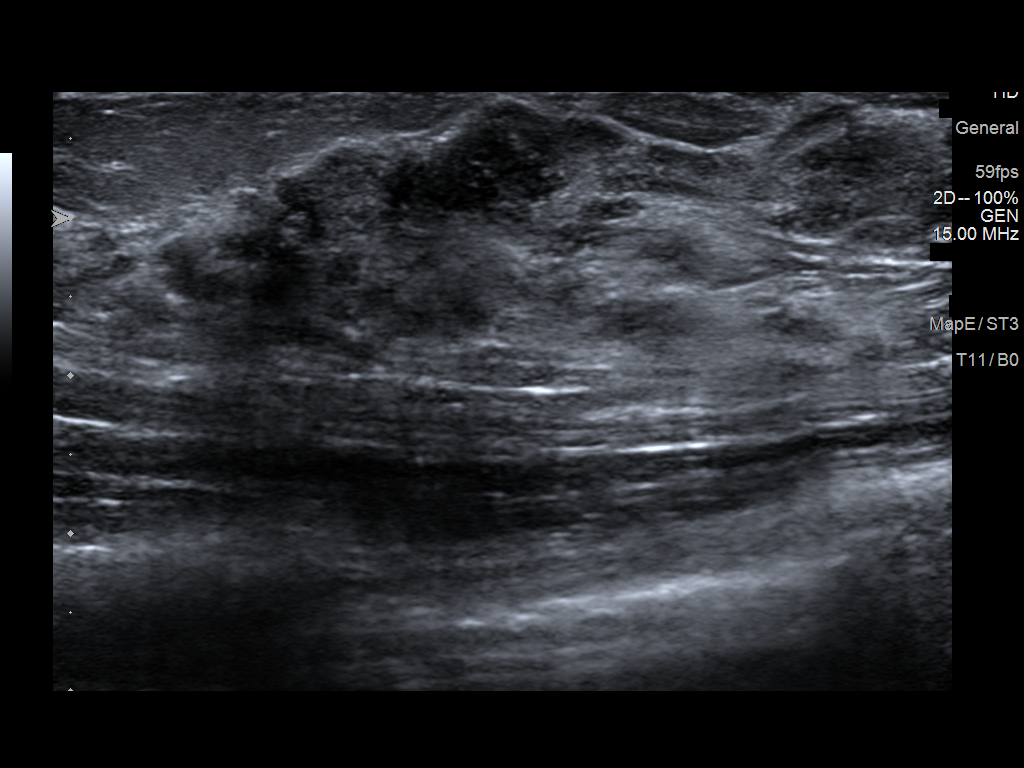

[4 of 4 positions shown; findings below may reference images not displayed]

FINDINGS: On physical exam, I palpate thickening in the 12 o'clock region of
the right breast 3 cm from the nipple.

Targeted ultrasound is performed, showing normal dense
fibroglandular tissue in the 12 o'clock region of the right breast 3
cm from the nipple. No suspicious mass, abnormal shadowing or
distortion detected.
IMPRESSION: No sonographic evidence of malignancy in the 12 o'clock region of
the right breast 3 cm from the nipple.

RECOMMENDATION:
If the clinical exam remains benign/stable screening mammography can
be deferred until the age of 40. The importance of clinical exam was
discussed with the patient.

I have discussed the findings and recommendations with the patient.
If applicable, a reminder letter will be sent to the patient
regarding the next appointment.

BI-RADS CATEGORY  1: Negative.

## 2022-08-07 ENCOUNTER — Encounter: Payer: Self-pay | Admitting: Family Medicine
